# Patient Record
Sex: Female | Born: 1945 | Race: Black or African American | Hispanic: No | Smoking: Never smoker
Health system: Southern US, Community
[De-identification: ages and names within clinical notes are randomized; demographics above are authoritative.]

## PROBLEM LIST (undated history)

## (undated) DIAGNOSIS — J45909 Unspecified asthma, uncomplicated: Secondary | ICD-10-CM

## (undated) DIAGNOSIS — C649 Malignant neoplasm of unspecified kidney, except renal pelvis: Secondary | ICD-10-CM

## (undated) HISTORY — PX: APPENDECTOMY: SHX54

## (undated) HISTORY — PX: CHOLECYSTECTOMY: SHX55

## (undated) HISTORY — PX: ABDOMINAL HYSTERECTOMY: SHX81

## (undated) HISTORY — PX: BREAST SURGERY: SHX581

---

## 2017-05-18 ENCOUNTER — Emergency Department (HOSPITAL_BASED_OUTPATIENT_CLINIC_OR_DEPARTMENT_OTHER): Payer: Medicare Other

## 2017-05-18 ENCOUNTER — Observation Stay (HOSPITAL_BASED_OUTPATIENT_CLINIC_OR_DEPARTMENT_OTHER)
Admission: EM | Admit: 2017-05-18 | Discharge: 2017-05-19 | Disposition: A | Discharge status: Home / Self Care | Payer: Medicare Other | Attending: Family Medicine | Admitting: Family Medicine

## 2017-05-18 ENCOUNTER — Encounter (HOSPITAL_BASED_OUTPATIENT_CLINIC_OR_DEPARTMENT_OTHER): Payer: Self-pay | Admitting: *Deleted

## 2017-05-18 ENCOUNTER — Other Ambulatory Visit: Payer: Self-pay

## 2017-05-18 DIAGNOSIS — A419 Sepsis, unspecified organism: Secondary | ICD-10-CM | POA: Diagnosis present

## 2017-05-18 DIAGNOSIS — Z79899 Other long term (current) drug therapy: Secondary | ICD-10-CM | POA: Diagnosis not present

## 2017-05-18 DIAGNOSIS — J45901 Unspecified asthma with (acute) exacerbation: Principal | ICD-10-CM | POA: Diagnosis present

## 2017-05-18 DIAGNOSIS — R030 Elevated blood-pressure reading, without diagnosis of hypertension: Secondary | ICD-10-CM | POA: Diagnosis present

## 2017-05-18 DIAGNOSIS — Z85528 Personal history of other malignant neoplasm of kidney: Secondary | ICD-10-CM | POA: Diagnosis not present

## 2017-05-18 DIAGNOSIS — E876 Hypokalemia: Secondary | ICD-10-CM | POA: Diagnosis not present

## 2017-05-18 HISTORY — DX: Malignant neoplasm of unspecified kidney, except renal pelvis: C64.9

## 2017-05-18 HISTORY — DX: Unspecified asthma, uncomplicated: J45.909

## 2017-05-18 LAB — BASIC METABOLIC PANEL
ANION GAP: 9 (ref 5–15)
BUN: 11 mg/dL (ref 6–20)
CHLORIDE: 101 mmol/L (ref 101–111)
CO2: 26 mmol/L (ref 22–32)
Calcium: 8.2 mg/dL — ABNORMAL LOW (ref 8.9–10.3)
Creatinine, Ser: 1.02 mg/dL — ABNORMAL HIGH (ref 0.44–1.00)
GFR calc Af Amer: >60 mL/min (ref >60)
GFR calc non Af Amer: 53 mL/min — ABNORMAL LOW (ref >60)
GLUCOSE: 135 mg/dL — AB (ref 65–99)
POTASSIUM: 2.9 mmol/L — AB (ref 3.5–5.1)
Sodium: 136 mmol/L (ref 135–145)

## 2017-05-18 LAB — CBC WITH DIFFERENTIAL/PLATELET
BASOS ABS: 0 10*3/uL (ref 0.0–0.1)
Basophils Relative: 0 %
EOS PCT: 1 %
Eosinophils Absolute: 0.1 10*3/uL (ref 0.0–0.7)
HEMATOCRIT: 39.6 % (ref 36.0–46.0)
Hemoglobin: 12.9 g/dL (ref 12.0–15.0)
LYMPHS ABS: 2.5 10*3/uL (ref 0.7–4.0)
LYMPHS PCT: 21 %
MCH: 27.8 pg (ref 26.0–34.0)
MCHC: 32.6 g/dL (ref 30.0–36.0)
MCV: 85.3 fL (ref 78.0–100.0)
MONO ABS: 0.9 10*3/uL (ref 0.1–1.0)
Monocytes Relative: 8 %
NEUTROS ABS: 8.4 10*3/uL — AB (ref 1.7–7.7)
Neutrophils Relative %: 70 %
PLATELETS: 251 10*3/uL (ref 150–400)
RBC: 4.64 MIL/uL (ref 3.87–5.11)
RDW: 13.7 % (ref 11.5–15.5)
WBC: 11.8 10*3/uL — ABNORMAL HIGH (ref 4.0–10.5)

## 2017-05-18 LAB — TROPONIN I: Troponin I: <0.03 ng/mL (ref <0.03)

## 2017-05-18 MED ORDER — POTASSIUM CHLORIDE CRYS ER 20 MEQ PO TBCR
EXTENDED_RELEASE_TABLET | ORAL | Status: AC
  Filled 2017-05-18: qty 1

## 2017-05-18 MED ORDER — POTASSIUM CHLORIDE CRYS ER 20 MEQ PO TBCR
60.0000 meq | EXTENDED_RELEASE_TABLET | Freq: Once | ORAL | Status: AC
  Administered 2017-05-18: 60 meq via ORAL
  Filled 2017-05-18: qty 3

## 2017-05-18 MED ORDER — IPRATROPIUM BROMIDE 0.02 % IN SOLN
0.5000 mg | RESPIRATORY_TRACT | Status: DC
  Administered 2017-05-18 – 2017-05-19 (×2): 0.5 mg via RESPIRATORY_TRACT
  Filled 2017-05-18 (×2): qty 2.5

## 2017-05-18 MED ORDER — ALBUTEROL (5 MG/ML) CONTINUOUS INHALATION SOLN
10.0000 mg/h | INHALATION_SOLUTION | RESPIRATORY_TRACT | Status: AC
Start: 2017-05-18 — End: 2017-05-18
  Administered 2017-05-18: 10 mg/h via RESPIRATORY_TRACT
  Filled 2017-05-18: qty 20

## 2017-05-18 MED ORDER — ALBUTEROL SULFATE (2.5 MG/3ML) 0.083% IN NEBU
2.5000 mg | INHALATION_SOLUTION | Freq: Once | RESPIRATORY_TRACT | Status: AC
  Administered 2017-05-18: 2.5 mg via RESPIRATORY_TRACT
  Filled 2017-05-18: qty 3

## 2017-05-18 MED ORDER — IPRATROPIUM BROMIDE 0.02 % IN SOLN
0.5000 mg | Freq: Once | RESPIRATORY_TRACT | Status: AC
  Administered 2017-05-18: 0.5 mg via RESPIRATORY_TRACT
  Filled 2017-05-18: qty 2.5

## 2017-05-18 MED ORDER — ALBUTEROL SULFATE (2.5 MG/3ML) 0.083% IN NEBU
5.0000 mg | INHALATION_SOLUTION | Freq: Once | RESPIRATORY_TRACT | Status: AC
  Administered 2017-05-18: 5 mg via RESPIRATORY_TRACT
  Filled 2017-05-18: qty 6

## 2017-05-18 MED ORDER — MAGNESIUM SULFATE 2 GM/50ML IV SOLN
2.0000 g | Freq: Once | INTRAVENOUS | Status: AC
  Administered 2017-05-18: 2 g via INTRAVENOUS
  Filled 2017-05-18: qty 50

## 2017-05-18 MED ORDER — MAGNESIUM SULFATE 50 % IJ SOLN
2.0000 g | Freq: Once | INTRAMUSCULAR | Status: DC
  Filled 2017-05-18: qty 4

## 2017-05-18 MED ORDER — ALBUTEROL SULFATE (2.5 MG/3ML) 0.083% IN NEBU: 2.5000 mg | INHALATION_SOLUTION | RESPIRATORY_TRACT | Status: DC | PRN

## 2017-05-18 MED ORDER — METHYLPREDNISOLONE SODIUM SUCC 125 MG IJ SOLR: 125.0000 mg | Freq: Once | INTRAMUSCULAR | Status: DC

## 2017-05-18 MED ORDER — DM-GUAIFENESIN ER 30-600 MG PO TB12: 1.0000 | ORAL_TABLET | Freq: Two times a day (BID) | ORAL | Status: DC

## 2017-05-18 MED ORDER — ZOLPIDEM TARTRATE 5 MG PO TABS
5.0000 mg | ORAL_TABLET | Freq: Every evening | ORAL | Status: DC | PRN
  Administered 2017-05-18: 5 mg via ORAL
  Filled 2017-05-18: qty 1

## 2017-05-18 MED ORDER — PREDNISONE 50 MG PO TABS
60.0000 mg | ORAL_TABLET | Freq: Once | ORAL | Status: AC
  Administered 2017-05-18: 60 mg via ORAL
  Filled 2017-05-18: qty 1

## 2017-05-18 MED ORDER — IPRATROPIUM-ALBUTEROL 0.5-2.5 (3) MG/3ML IN SOLN
3.0000 mL | Freq: Once | RESPIRATORY_TRACT | Status: AC
  Administered 2017-05-18: 3 mL via RESPIRATORY_TRACT
  Filled 2017-05-18: qty 3

## 2017-05-18 MED ORDER — DM-GUAIFENESIN ER 30-600 MG PO TB12
1.0000 | ORAL_TABLET | Freq: Two times a day (BID) | ORAL | Status: DC | PRN
  Administered 2017-05-18: 1 via ORAL
  Filled 2017-05-18: qty 1

## 2017-05-18 MED ORDER — ALBUTEROL SULFATE (2.5 MG/3ML) 0.083% IN NEBU: 2.5000 mg | INHALATION_SOLUTION | Freq: Four times a day (QID) | RESPIRATORY_TRACT | Status: DC | PRN

## 2017-05-18 NOTE — ED Notes (Signed)
Unsuccessful IV attempt x 2, left AC and right hand.

## 2017-05-18 NOTE — ED Triage Notes (Signed)
Pt c/o URI symptoms 1 week, seen last week in [facility], no relief with meds

## 2017-05-18 NOTE — H&P (Signed)
History and Physical    Jessica Sparks FGH:[id number] DOB: December 09, 1945 DOA: 05/18/2017  Referring MD/NP/PA:   PCP: Patient, No Pcp Per   Patient coming from:  The patient is coming from home.  At baseline, pt is independent for most of ADL.  Chief Complaint: Shortness of breath, and cough  HPI: Jessica Sparks is a 71 y.o. female with medical history significant of asthma, renal cell cancer (right nephrectomy 8 years ago, no radiation or chemotherapy per patient), who presents with shortness of breath and cough.  Patient states that she has been having cough, shortness breath for about 1 week, which has worsened in the past 2 days. She coughs up white mucus. She also has mild runny nose and sore throat. She has pleuritic chest pain when coughing, which is located in the frontal chest, sharp, 4 out of 10 in severity, nonradiating. No nausea, vomiting, diarrhea or abdominal pain. No symptoms of UTI or unilateral weakness. No fever or chills. Patient speaks in full sentence.  ED Course: pt was found to have WBC 11.8, negative troponin, potassium 2.9, creatinine normal, temperature normal, tachycardia, tachypnea, oxygen saturation section 96% on room air, chest x-ray negative for infiltration. Patient is admitted to [place] bed as inpatient.   Review of Systems:   General: no fevers, chills, no body weight gain, has fatigue HEENT: no blurry vision, hearing changes or sore throat Respiratory: has dyspnea, coughing, wheezing CV: has chest pain, no palpitations GI: no nausea, vomiting, abdominal pain, diarrhea, constipation GU: no dysuria, burning on urination, increased urinary frequency, hematuria  Ext: no leg edema Neuro: no unilateral weakness, numbness, or tingling, no vision change or hearing loss Skin: no rash, no skin tear. MSK: No muscle spasm, no deformity, no limitation of range of movement in spin Heme: No easy bruising.  Travel history: No recent long distant travel.  Allergy:    Allergies  Allergen Reactions  . Citrus Itching    Mouth itches with excessive citrus  . Sulfa Antibiotics     Past Medical History:  Diagnosis Date  . Asthma   . Renal cancer [facility])     Past Surgical History:  Procedure Laterality Date  . ABDOMINAL HYSTERECTOMY    . APPENDECTOMY    . BREAST SURGERY    . CHOLECYSTECTOMY      Social History:  reports that  has never smoked. she has never used smokeless tobacco. She reports that she does not drink alcohol or use drugs.  Family History:  Family History  Problem Relation Age of Onset  . Hypertension Mother   . Hypertension Father   . Hypertension Sister   . Hypertension Brother      Prior to Admission medications   Medication Sig Start Date End Date Taking? Authorizing Provider  acetaminophen (TYLENOL) 500 MG tablet Take 500 mg by mouth every 6 (six) hours as needed for mild pain or headache.   Yes [provider]  albuterol (PROVENTIL) (2.5 MG/3ML) 0.083% nebulizer solution Take 2.5 mg by nebulization every 6 (six) hours as needed for wheezing or shortness of breath.   Yes [provider]  Aspirin-Salicylamide-Caffeine (BC HEADACHE POWDER PO) Take 1 packet by mouth daily as needed (pain).   Yes [provider]  fluticasone (FLONASE) 50 MCG/ACT nasal spray Place 2 sprays into both nostrils daily as needed for allergies or rhinitis.   Yes [provider]  Fluticasone Furoate (ARNUITY ELLIPTA) 50 MCG/ACT AEPB Inhale 1 puff into the lungs daily.   Yes [provider]  Fluticasone-Umeclidin-Vilant (TRELEGY ELLIPTA) 100-62.5-25 MCG/INH AEPB Inhale 1 puff into the lungs daily.   Yes [provider]  levocetirizine (XYZAL) 5 MG tablet Take 5 mg by mouth daily as needed for allergies.   Yes [provider]    Physical Exam: Vitals:   05/18/17 1937 05/18/17 2143 05/18/17 2319 05/19/17 0500  BP: (!) 160/75 (!) 171/67  (!) 154/62  Pulse: (!) 108 (!) 106  77  Resp: (!) 24  20  20   Temp:  98.7 F (37.1 C)  98.2 F (36.8 C)  TempSrc:  Oral  Oral  SpO2: 96% 96% 97% 98%  Weight:      Height:       General: Not in acute distress HEENT:       Eyes: PERRL, EOMI, no scleral icterus.       ENT: No discharge from the ears and nose, no pharynx injection, no tonsillar enlargement.        Neck: No JVD, no bruit, no mass felt. Heme: No neck lymph node enlargement. Cardiac: S1/S2, RRR, No murmurs, No gallops or rubs. Respiratory: Has wheezing bilaterally. GI: Soft, nondistended, nontender, no rebound pain, no organomegaly, BS present. GU: No hematuria Ext: No pitting leg edema bilaterally. 2+DP/PT pulse bilaterally. Musculoskeletal: No joint deformities, No joint redness or warmth, no limitation of ROM in spin. Skin: No rashes.  Neuro: Alert, oriented X3, cranial nerves II-XII grossly intact, moves all extremities normally. Psych: Patient is not psychotic, no suicidal or hemocidal ideation.  Labs on Admission: I have personally reviewed following labs and imaging studies  CBC: Recent Labs  Lab 05/18/17 1530  WBC 11.8*  NEUTROABS 8.4*  HGB 12.9  HCT 39.6  MCV 85.3  PLT 696   Basic Metabolic Panel: Recent Labs  Lab 05/18/17 1530 05/19/17 0518  NA 136  --   K 2.9*  --   CL 101  --   CO2 26  --   GLUCOSE 135*  --   BUN 11  --   CREATININE 1.02*  --   CALCIUM 8.2*  --   MG  --  2.2   GFR: Estimated Creatinine Clearance: 41.6 mL/min (A) (by C-G formula based on SCr of 1.02 mg/dL (H)). Liver Function Tests: No results for input(s): AST, ALT, ALKPHOS, BILITOT, PROT, ALBUMIN in the last 168 hours. No results for input(s): LIPASE, AMYLASE in the last 168 hours. No results for input(s): AMMONIA in the last 168 hours. Coagulation Profile: No results for input(s): INR, PROTIME in the last 168 hours. Cardiac Enzymes: Recent Labs  Lab 05/18/17 1427  TROPONINI <0.03   BNP (last 3 results) No results for input(s): PROBNP in the last 8760  hours. HbA1C: No results for input(s): HGBA1C in the last 72 hours. CBG: No results for input(s): GLUCAP in the last 168 hours. Lipid Profile: No results for input(s): CHOL, HDL, LDLCALC, TRIG, CHOLHDL, LDLDIRECT in the last 72 hours. Thyroid Function Tests: No results for input(s): TSH, T4TOTAL, FREET4, T3FREE, THYROIDAB in the last 72 hours. Anemia Panel: No results for input(s): VITAMINB12, FOLATE, FERRITIN, TIBC, IRON, RETICCTPCT in the last 72 hours. Urine analysis: No results found for: COLORURINE, APPEARANCEUR, LABSPEC, PHURINE, GLUCOSEU, HGBUR, BILIRUBINUR, KETONESUR, PROTEINUR, UROBILINOGEN, NITRITE, LEUKOCYTESUR Sepsis Labs: @LABRCNTIP (procalcitonin:4,lacticidven:4) )No results found for this or any previous visit (from the past 240 hour(s)).   Radiological Exams on Admission: Dg Chest 2 View  Result Date: 05/18/2017 CLINICAL DATA:  Cough, chest pain, shortness of Breath EXAM: CHEST  2 VIEW  COMPARISON:  None. FINDINGS: Cardiomegaly. Lungs are clear. No effusions. No acute bony abnormality. IMPRESSION: Cardiomegaly.  No active disease. Electronically Signed   By: Rolm Baptise M.D.   On: 05/18/2017 13:38   Dg Chest Port 1 View  Result Date: 05/19/2017 CLINICAL DATA:  Shortness of breath and sepsis EXAM: PORTABLE CHEST 1 VIEW COMPARISON:  05/18/2017 FINDINGS: The heart size and mediastinal contours are within normal limits. Both lungs are clear. The visualized skeletal structures are unremarkable. IMPRESSION: No active disease. Electronically Signed   By: Donavan Foil M.D.   On: 05/19/2017 03:51     EKG: Independently reviewed.  Not done in ED, will get one.   Assessment/Plan Principal Problem:   Asthma exacerbation Active Problems:   Hypokalemia   Elevated blood pressure reading   Sepsis ([place])   Asthma with acute exacerbation   Asthma exacerbation and sepsis: Patient's productive cough, wheezing, negative chest x-ray, consistent with asthma exacerbation. Patient meets  criteria for sepsis with leukocytosis, tachycardia and tachypnea. Currently hemodynamically stable.  -will admit to tele bed as inpt -Nebulizers:  Scheduled Atrovent and prn Xopenex nebs -Solu-Medrol 80 mg IV q8h - Z-pak  -Mucinex for cough  -Urine S. pneumococcal antigen -Follow up blood culture x2, sputum culture, respiratory virus panel, Flu pcr -will get Procalcitonin and trend lactic acid levels per sepsis protocol. -IVF: 2L of NS bolus in ED, followed by 125 cc/h   Hypokalemia: K=2.9  on admission. - Repleted - Give 1 g of magnesium sulfate - Check Mg level  Elevated blood pressure reading: Blood pressure 171/67. No history of hypertension -When necessary hydralazine IV   DVT ppx:   SQ Lovenox Code Status: Full code Family Communication: None at bed side.    Disposition Plan:  Anticipate discharge back to previous home environment Consults called:  none Admission status:   medical floor/inpt  Date of Service 05/19/2017    Ivor Costa [facility] Pager [phone]  If 7PM-7AM, please contact night-coverage [email or web address] Password [facility] 05/19/2017, 6:32 AM

## 2017-05-18 NOTE — ED Provider Notes (Signed)
[facility] EMERGENCY DEPARTMENT Provider Note   CSN: [id number] Arrival date & time: 05/18/17  1237     History   Chief Complaint Chief Complaint  Patient presents with  . URI    HPI Jessica Sparks is a 71 y.o. female with history of asthma who presents with over 1 week history of cough, shortness of breath, wheezing.  Patient began having chest pain today.  It is worse with coughing and deep breathing, however it is a constant ache at rest.  Patient reports she has been taking montelukast, Tessalon, and was given methylprednisone at [facility] emergency department 1 week ago.  She denies any improvement after these medications.  She reports she has felt like she had a fever at home.  She reports worsening symptoms over the past week.  She denies any abdominal pain, nausea, vomiting, urinary symptoms.  She denies any recent long trips, surgeries, known cancer, new leg pain or swelling, history of blood clots.  HPI  Past Medical History:  Diagnosis Date  . Asthma   . Renal cancer [facility])     Patient Active Problem List   Diagnosis Date Noted  . Asthma exacerbation 05/18/2017    Past Surgical History:  Procedure Laterality Date  . ABDOMINAL HYSTERECTOMY    . APPENDECTOMY    . CHOLECYSTECTOMY      OB History    No data available       Home Medications    Prior to Admission medications   Not on File    Family History No family history on file.  Social History Social History   Tobacco Use  . Smoking status: Never Smoker  Substance Use Topics  . Alcohol use: No    Frequency: Never  . Drug use: No     Allergies   Sulfa antibiotics   Review of Systems Review of Systems  Constitutional: Positive for fever (subjective). Negative for chills.  HENT: Negative for facial swelling and sore throat.   Respiratory: Positive for cough, shortness of breath and wheezing.   Cardiovascular: Positive for chest pain.  Gastrointestinal: Negative for abdominal  pain, nausea and vomiting.  Genitourinary: Negative for dysuria.  Musculoskeletal: Negative for back pain.  Skin: Negative for rash and wound.  Neurological: Negative for headaches.  Psychiatric/Behavioral: The patient is not nervous/anxious.      Physical Exam Updated Vital Signs BP (!) 147/69   Pulse (!) 109   Temp 98.9 F (37.2 C) (Oral)   Resp (!) 22   Ht 5' (1.524 m)   Wt 65.8 kg (145 lb)   SpO2 95%   BMI 28.32 kg/m   Physical Exam  Constitutional: She appears well-developed and well-nourished. No distress.  HENT:  Head: Normocephalic and atraumatic.  Mouth/Throat: Oropharynx is clear and moist. No oropharyngeal exudate.  Eyes: Conjunctivae are normal. Pupils are equal, round, and reactive to light. Right eye exhibits no discharge. Left eye exhibits no discharge. No scleral icterus.  Neck: Normal range of motion. Neck supple. No thyromegaly present.  Cardiovascular: Normal rate, regular rhythm, normal heart sounds and intact distal pulses. Exam reveals no gallop and no friction rub.  No murmur heard. Pulmonary/Chest: Effort normal. No stridor. No respiratory distress. She has wheezes (Expiratory bilaterally in all lung fields). She has no rales.  Abdominal: Soft. Bowel sounds are normal. She exhibits no distension. There is no tenderness. There is no rebound and no guarding.  Musculoskeletal: She exhibits no edema.  Lymphadenopathy:    She has no cervical  adenopathy.  Neurological: She is alert. Coordination normal.  Skin: Skin is warm and dry. No rash noted. She is not diaphoretic. No pallor.  Psychiatric: She has a normal mood and affect.  Nursing note and vitals reviewed.    ED Treatments / Results  Labs (all labs ordered are listed, but only abnormal results are displayed) Labs Reviewed  BASIC METABOLIC PANEL - Abnormal; Notable for the following components:      Result Value   Potassium 2.9 (*)    Glucose, Bld 135 (*)    Creatinine, Ser 1.02 (*)     Calcium 8.2 (*)    GFR calc non Af Amer 53 (*)    All other components within normal limits  CBC WITH DIFFERENTIAL/PLATELET - Abnormal; Notable for the following components:   WBC 11.8 (*)    Neutro Abs 8.4 (*)    All other components within normal limits  TROPONIN I  CBC WITH DIFFERENTIAL/PLATELET    EKG  EKG Interpretation  Date/Time:  Saturday May 18 2017 13:14:02 EST Ventricular Rate:  83 PR Interval:    QRS Duration: 91 QT Interval:  320 QTC Calculation: 376 R Axis:   -28 Text Interpretation:  Sinus rhythm Nonspecific T wave abnormality Baseline wander No previous tracing Confirmed by Lajean Saver [phone]) on 05/18/2017 1:17:20 PM       Radiology Dg Chest 2 View  Result Date: 05/18/2017 CLINICAL DATA:  Cough, chest pain, shortness of Breath EXAM: CHEST  2 VIEW COMPARISON:  None. FINDINGS: Cardiomegaly. Lungs are clear. No effusions. No acute bony abnormality. IMPRESSION: Cardiomegaly.  No active disease. Electronically Signed   By: Rolm Baptise M.D.   On: 05/18/2017 13:38    Procedures Procedures (including critical care time)  Medications Ordered in ED Medications  albuterol (PROVENTIL,VENTOLIN) solution continuous neb (0 mg/hr Nebulization Stopped 05/18/17 1645)  magnesium sulfate IVPB 2 g 50 mL (2 g Intravenous New Bag/Given 05/18/17 1707)  albuterol (PROVENTIL) (2.5 MG/3ML) 0.083% nebulizer solution 2.5 mg (not administered)  ipratropium-albuterol (DUONEB) 0.5-2.5 (3) MG/3ML nebulizer solution 3 mL (3 mLs Nebulization Given 05/18/17 1300)  albuterol (PROVENTIL) (2.5 MG/3ML) 0.083% nebulizer solution 2.5 mg (2.5 mg Nebulization Given 05/18/17 1300)  albuterol (PROVENTIL) (2.5 MG/3ML) 0.083% nebulizer solution 5 mg (5 mg Nebulization Given 05/18/17 1414)  ipratropium (ATROVENT) nebulizer solution 0.5 mg (0.5 mg Nebulization Given 05/18/17 1414)  predniSONE (DELTASONE) tablet 60 mg (60 mg Oral Given 05/18/17 1420)  potassium chloride SA (K-DUR,KLOR-CON) CR tablet 60  mEq (60 mEq Oral Given 05/18/17 1639)     Initial Impression / Assessment and Plan / ED Course  I have reviewed the triage vital signs and the nursing notes.  Pertinent labs & imaging results that were available during my care of the patient were reviewed by me and considered in my medical decision making (see chart for details).     Patient with asthma exacerbation unimproved with 2 DuoNeb treatments and continuous albuterol treatment in the ED.  Patient reports she is feeling mildly better, however her lung exam is not improved at all.  Patient still with expiratory wheezing bilaterally that is essentially unchanged from first exam.  Chest x-ray shows cardiomegaly, but no other active disease.  Will give magnesium 2 g.  Patient also given potassium, as patient is hypokalemic at 2.9.  I consulted [facility] and spoke with Dr. Charlies Silvers who will admit the patient to [place] at [facility].  Patient understands and agrees with plan.  Patient also evaluated by Dr. Ashok Cordia who  had the patient's management and agrees with plan.  Final Clinical Impressions(s) / ED Diagnoses   Final diagnoses:  Asthma with acute exacerbation, unspecified asthma severity, unspecified whether persistent    ED Discharge Orders    None       Frederica Kuster, PA-C 05/18/17 1746    Lajean Saver, MD 05/19/17 [phone]

## 2017-05-19 ENCOUNTER — Encounter (HOSPITAL_COMMUNITY): Payer: Self-pay

## 2017-05-19 ENCOUNTER — Inpatient Hospital Stay (HOSPITAL_COMMUNITY): Payer: Medicare Other

## 2017-05-19 DIAGNOSIS — A419 Sepsis, unspecified organism: Secondary | ICD-10-CM | POA: Diagnosis present

## 2017-05-19 DIAGNOSIS — E876 Hypokalemia: Secondary | ICD-10-CM

## 2017-05-19 DIAGNOSIS — R030 Elevated blood-pressure reading, without diagnosis of hypertension: Secondary | ICD-10-CM | POA: Diagnosis not present

## 2017-05-19 DIAGNOSIS — J45901 Unspecified asthma with (acute) exacerbation: Secondary | ICD-10-CM

## 2017-05-19 LAB — LACTIC ACID, PLASMA
Lactic Acid, Venous: 1.8 mmol/L (ref 0.5–1.9)
Lactic Acid, Venous: 2.5 mmol/L (ref 0.5–1.9)
Lactic Acid, Venous: 1.2 mmol/L (ref 0.5–1.9)

## 2017-05-19 LAB — BASIC METABOLIC PANEL
Anion gap: 6 (ref 5–15)
BUN: 13 mg/dL (ref 6–20)
CHLORIDE: 110 mmol/L (ref 101–111)
CO2: 21 mmol/L — AB (ref 22–32)
CREATININE: 0.85 mg/dL (ref 0.44–1.00)
Calcium: 8.2 mg/dL — ABNORMAL LOW (ref 8.9–10.3)
GFR calc Af Amer: >60 mL/min (ref >60)
GFR calc non Af Amer: >60 mL/min (ref >60)
GLUCOSE: 131 mg/dL — AB (ref 65–99)
Potassium: 4.3 mmol/L (ref 3.5–5.1)
Sodium: 137 mmol/L (ref 135–145)

## 2017-05-19 LAB — STREP PNEUMONIAE URINARY ANTIGEN: STREP PNEUMO URINARY ANTIGEN: NEGATIVE

## 2017-05-19 LAB — EXPECTORATED SPUTUM ASSESSMENT W GRAM STAIN, RFLX TO RESP C

## 2017-05-19 LAB — EXPECTORATED SPUTUM ASSESSMENT W REFEX TO RESP CULTURE

## 2017-05-19 LAB — MAGNESIUM: Magnesium: 2.2 mg/dL (ref 1.7–2.4)

## 2017-05-19 LAB — PROCALCITONIN: Procalcitonin: <0.1 ng/mL

## 2017-05-19 MED ORDER — SODIUM CHLORIDE 0.9 % IV SOLN
INTRAVENOUS | Status: DC
  Administered 2017-05-19: 05:00:00 via INTRAVENOUS

## 2017-05-19 MED ORDER — HYDRALAZINE HCL 20 MG/ML IJ SOLN: 5.0000 mg | INTRAMUSCULAR | Status: DC | PRN

## 2017-05-19 MED ORDER — AZITHROMYCIN 250 MG PO TABS: 250.0000 mg | ORAL_TABLET | Freq: Every day | ORAL | Status: DC

## 2017-05-19 MED ORDER — IPRATROPIUM BROMIDE 0.02 % IN SOLN
0.5000 mg | Freq: Four times a day (QID) | RESPIRATORY_TRACT | Status: DC
  Administered 2017-05-19 (×2): 0.5 mg via RESPIRATORY_TRACT
  Filled 2017-05-19 (×2): qty 2.5

## 2017-05-19 MED ORDER — ACETAMINOPHEN 325 MG PO TABS: 650.0000 mg | ORAL_TABLET | Freq: Four times a day (QID) | ORAL | Status: DC | PRN

## 2017-05-19 MED ORDER — AZITHROMYCIN 250 MG PO TABS
250.0000 mg | ORAL_TABLET | Freq: Every day | ORAL | 0 refills | Status: AC
Start: 2017-05-19 — End: 2017-05-22

## 2017-05-19 MED ORDER — PREDNISONE 10 MG PO TABS: ORAL_TABLET | ORAL | 0 refills | Status: AC

## 2017-05-19 MED ORDER — SODIUM CHLORIDE 0.9 % IV BOLUS (SEPSIS)
2000.0000 mL | Freq: Once | INTRAVENOUS | Status: AC
  Administered 2017-05-19: 2000 mL via INTRAVENOUS

## 2017-05-19 MED ORDER — AZITHROMYCIN 250 MG PO TABS
500.0000 mg | ORAL_TABLET | Freq: Every day | ORAL | Status: AC
  Administered 2017-05-19: 500 mg via ORAL
  Filled 2017-05-19: qty 2

## 2017-05-19 MED ORDER — LEVALBUTEROL HCL 1.25 MG/0.5ML IN NEBU: 1.2500 mg | INHALATION_SOLUTION | Freq: Four times a day (QID) | RESPIRATORY_TRACT | Status: DC

## 2017-05-19 MED ORDER — POTASSIUM CHLORIDE 10 MEQ/100ML IV SOLN
10.0000 meq | INTRAVENOUS | Status: AC
  Administered 2017-05-19 (×2): 10 meq via INTRAVENOUS
  Filled 2017-05-19 (×2): qty 100

## 2017-05-19 MED ORDER — AZITHROMYCIN 250 MG PO TABS
250.0000 mg | ORAL_TABLET | Freq: Every day | ORAL | Status: DC
Start: 2017-05-20 — End: 2017-05-19

## 2017-05-19 MED ORDER — AZITHROMYCIN 250 MG PO TABS: 500.0000 mg | ORAL_TABLET | Freq: Every day | ORAL | Status: DC

## 2017-05-19 MED ORDER — ENOXAPARIN SODIUM 40 MG/0.4ML ~~LOC~~ SOLN
40.0000 mg | SUBCUTANEOUS | Status: DC
  Administered 2017-05-19: 40 mg via SUBCUTANEOUS
  Filled 2017-05-19: qty 0.4

## 2017-05-19 MED ORDER — METHYLPREDNISOLONE SODIUM SUCC 125 MG IJ SOLR
60.0000 mg | Freq: Three times a day (TID) | INTRAMUSCULAR | Status: DC
  Administered 2017-05-19 (×2): 60 mg via INTRAVENOUS
  Filled 2017-05-19 (×2): qty 2

## 2017-05-19 MED ORDER — LEVALBUTEROL HCL 1.25 MG/0.5ML IN NEBU
1.2500 mg | INHALATION_SOLUTION | Freq: Four times a day (QID) | RESPIRATORY_TRACT | Status: DC
  Administered 2017-05-19 (×2): 1.25 mg via RESPIRATORY_TRACT
  Filled 2017-05-19 (×2): qty 0.5

## 2017-05-19 MED ORDER — HYDRALAZINE HCL 25 MG PO TABS
25.0000 mg | ORAL_TABLET | Freq: Once | ORAL | Status: AC
  Administered 2017-05-19: 25 mg via ORAL
  Filled 2017-05-19: qty 1

## 2017-05-19 MED ORDER — DM-GUAIFENESIN ER 30-600 MG PO TB12: 1.0000 | ORAL_TABLET | Freq: Two times a day (BID) | ORAL | 0 refills | Status: AC | PRN

## 2017-05-19 MED ORDER — ONDANSETRON HCL 4 MG/2ML IJ SOLN: 4.0000 mg | Freq: Three times a day (TID) | INTRAMUSCULAR | Status: DC | PRN

## 2017-05-19 NOTE — Care Management Note (Signed)
Case Management Note  Patient Details  Name: Jessica Sparks MRN: [id number] Date of Birth: Jan 30, 1946  Subjective/Objective:    Asthma exacerbation                 Action/Plan: Discharge Planning: NCM spoke to pt and she lives at home. 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 She was independent at home. She will stay with a daughter this evening. Has a neb machine at home.    PCP Tamela Gammon MD   Expected Discharge Date:  05/19/17               Expected Discharge Plan:  Home/Self Care  In-House Referral:  NA  Discharge planning Services  CM Consult  Post Acute Care Choice:  NA Choice offered to:  NA  DME Arranged:  N/A DME Agency:  NA  HH Arranged:  NA HH Agency:  NA  Status of Service:  Completed, signed off  If discussed at [place] of Stay Meetings, dates discussed:    Additional Comments:  Erenest Rasher, RN 05/19/2017, 4:35 PM

## 2017-05-19 NOTE — Care Management Obs Status (Signed)
[place] NOTIFICATION   Patient Details  Name: Makaylen Thieme MRN: [id number] Date of Birth: 05/29/1946   Medicare Observation Status Notification Given:  Yes    Erenest Rasher, RN 05/19/2017, 4:31 PM

## 2017-05-19 NOTE — Care Management CC44 (Signed)
Condition Code 44 Documentation Completed  Patient Details  Name: Siddhi Dornbush MRN: [id number] Date of Birth: [birth year]   Condition Code 44 given:  Yes Patient signature on Condition Code 44 notice:  Yes Documentation of 2 MD's agreement:  Yes Code 44 added to claim:  Yes    Erenest Rasher, RN 05/19/2017, 4:31 PM

## 2017-05-19 NOTE — Discharge Summary (Signed)
Physician Discharge Summary  Jessica Sparks [id number]:[id number] DOB: 1946/02/24 DOA: 05/18/2017  PCP: Patient, No Pcp Per  Admit date: 05/18/2017 Discharge date: 05/19/2017  Time spent: 25 minutes  Recommendations for Outpatient Follow-up:  1. Follow up PCP in 2 weeks   Discharge Diagnoses:  Principal Problem:   Asthma exacerbation Active Problems:   Hypokalemia   Elevated blood pressure reading   Sepsis ([place])   Asthma with acute exacerbation   Discharge Condition: Stable  Diet recommendation: Regular diet  Filed Weights   05/18/17 1242  Weight: 65.8 kg (145 lb)    History of present illness:  71 y.o.femalewith medical history significant ofasthma, renal cell cancer (right nephrectomy 8 years ago,no radiation or chemotherapy per patient), who presents with shortness of breath and cough.    Hospital Course:  1. Asthma exacerbation-improved,  Was started on  Solu-Medrol, Mucinex, scheduled Atrovent and Xopenex nebulizer. Will discharge home on Po Zithromax 250 mg po daily x 3 days, Prednisone taper. 2. SIRS- resolved, came with leukocytosis, now has elevated lactic acid.  Chest x-ray showed no pneumonia.  Strep pneumo urinary antigen is negative.  Sputum culture is negative.  Patient currently on Zithromax.  Repeat lactic acid is 1.2 3. Hypokalemia-potassium was 2.9 yesterday, recheck potassium is 4.3 4. Hypertension- BP has been elevated, she takes antihypertensive  Medication at home and cannot remember the name of medication. Will give Hydralazine 25 mg po x 1 before discharge home. She has been instructed to check BP at home and call PCP to adjust medications if it remains elevated.     Procedures:  None   Consultations:  None   Discharge Exam: Vitals:   05/19/17 1019 05/19/17 1400  BP:  (!) 168/72  Pulse:  81  Resp:  18  Temp:  98.6 F (37 C)  SpO2: 98% 100%    General: Appears in no acute distress Cardiovascular: S1S2 RRR Respiratory: Clear  bilaterally  Discharge Instructions   Discharge Instructions    Diet - low sodium heart healthy   Complete by:  As directed    Increase activity slowly   Complete by:  As directed      Allergies as of 05/19/2017      Reactions   Citrus Itching   Mouth itches with excessive citrus   Sulfa Antibiotics       Medication List    TAKE these medications   acetaminophen 500 MG tablet Commonly known as:  TYLENOL Take 500 mg by mouth every 6 (six) hours as needed for mild pain or headache.   albuterol (2.5 MG/3ML) 0.083% nebulizer solution Commonly known as:  PROVENTIL Take 2.5 mg by nebulization every 6 (six) hours as needed for wheezing or shortness of breath.   ARNUITY ELLIPTA 50 MCG/ACT Aepb Generic drug:  Fluticasone Furoate Inhale 1 puff into the lungs daily.   azithromycin 250 MG tablet Commonly known as:  ZITHROMAX Take 1 tablet (250 mg total) by mouth daily for 3 days.   BC HEADACHE POWDER PO Take 1 packet by mouth daily as needed (pain).   dextromethorphan-guaiFENesin 30-600 MG 12hr tablet Commonly known as:  MUCINEX DM Take 1 tablet by mouth 2 (two) times daily as needed for cough.   fluticasone 50 MCG/ACT nasal spray Commonly known as:  FLONASE Place 2 sprays into both nostrils daily as needed for allergies or rhinitis.   levocetirizine 5 MG tablet Commonly known as:  XYZAL Take 5 mg by mouth daily as needed for allergies.   predniSONE 10  MG tablet Commonly known as:  DELTASONE Prednisone 40 mg po daily x 1 day then Prednisone 30 mg po daily x 1 day then Prednisone 20 mg po daily x 1 day then Prednisone 10 mg daily x 1 day then stop...   TRELEGY ELLIPTA 100-62.5-25 MCG/INH Aepb Generic drug:  Fluticasone-Umeclidin-Vilant Inhale 1 puff into the lungs daily.      Allergies  Allergen Reactions  . Citrus Itching    Mouth itches with excessive citrus  . Sulfa Antibiotics       The results of significant diagnostics from this hospitalization  (including imaging, microbiology, ancillary and laboratory) are listed below for reference.    Significant Diagnostic Studies: Dg Chest 2 View  Result Date: 05/18/2017 CLINICAL DATA:  Cough, chest pain, shortness of Breath EXAM: CHEST  2 VIEW COMPARISON:  None. FINDINGS: Cardiomegaly. Lungs are clear. No effusions. No acute bony abnormality. IMPRESSION: Cardiomegaly.  No active disease. Electronically Signed   By: Rolm Baptise M.D.   On: 05/18/2017 13:38   Dg Chest Port 1 View  Result Date: 05/19/2017 CLINICAL DATA:  Shortness of breath and sepsis EXAM: PORTABLE CHEST 1 VIEW COMPARISON:  05/18/2017 FINDINGS: The heart size and mediastinal contours are within normal limits. Both lungs are clear. The visualized skeletal structures are unremarkable. IMPRESSION: No active disease. Electronically Signed   By: Donavan Foil M.D.   On: 05/19/2017 03:51    Microbiology: Recent Results (from the past 240 hour(s))  Culture, sputum-assessment     Status: None   Collection Time: 05/19/17  7:32 AM  Result Value Ref Range Status   Specimen Description SPUTUM  Final   Special Requests NONE  Final   Sputum evaluation THIS SPECIMEN IS ACCEPTABLE FOR SPUTUM CULTURE  Final   Report Status 05/19/2017 FINAL  Final     Labs: Basic Metabolic Panel: Recent Labs  Lab 05/18/17 1530 05/19/17 0518 05/19/17 1417  NA 136  --  137  K 2.9*  --  4.3  CL 101  --  110  CO2 26  --  21*  GLUCOSE 135*  --  131*  BUN 11  --  13  CREATININE 1.02*  --  0.85  CALCIUM 8.2*  --  8.2*  MG  --  2.2  --    Liver Function Tests: No results for input(s): AST, ALT, ALKPHOS, BILITOT, PROT, ALBUMIN in the last 168 hours. No results for input(s): LIPASE, AMYLASE in the last 168 hours. No results for input(s): AMMONIA in the last 168 hours. CBC: Recent Labs  Lab 05/18/17 1530  WBC 11.8*  NEUTROABS 8.4*  HGB 12.9  HCT 39.6  MCV 85.3  PLT 251   Cardiac Enzymes: Recent Labs  Lab 05/18/17 1427  TROPONINI <0.03         Signed:  Oswald Hillock MD.  [facility] 05/19/2017, 4:19 PM

## 2017-05-19 NOTE — Progress Notes (Addendum)
[facility]  PROGRESS NOTE  Jessica Sparks QMV:[id number] DOB: [birth year] DOA: 05/18/2017 PCP: Patient, No Pcp Per   Brief HPI:   72 y.o. female with medical history significant of asthma, renal cell cancer (right nephrectomy 8 years ago, no radiation or chemotherapy per patient), who presents with shortness of breath and cough.    Subjective   Patient is breathing better.   Assessment/Plan:     1. Asthma exacerbation-improved, continue Solu-Medrol, Mucinex, scheduled Atrovent and Xopenex nebulizer. 2. SIRS-came with leukocytosis, now has elevated lactic acid.  Chest x-ray showed no pneumonia.  Strep pneumo urinary antigen is negative.  Sputum culture is negative.  Patient currently on Zithromax.  Repeat lactic acid is 1.2 3. Hypokalemia-potassium was 2.9 yesterday, recheck potassium is 4.3    DVT prophylaxis: Lovenox  Code Status: Full code  Family Communication: No family at bedside  Disposition Plan: Likely home in 1-2 days   Consultants:  None  Procedures:  None  Continuous infusions . sodium chloride 100 mL/hr at 05/19/17 0431      Antibiotics:   Anti-infectives (From admission, onward)   Start     Dose/Rate Route Frequency Ordered Stop   05/20/17 1000  azithromycin (ZITHROMAX) tablet 250 mg  Status:  Discontinued     250 mg Oral Daily 05/19/17 0315 05/19/17 0424   05/20/17 1000  azithromycin (ZITHROMAX) tablet 250 mg     250 mg Oral Daily 05/19/17 0424 05/24/17 0959   05/19/17 1000  azithromycin (ZITHROMAX) tablet 500 mg  Status:  Discontinued     500 mg Oral Daily 05/19/17 0315 05/19/17 0424   05/19/17 0600  azithromycin (ZITHROMAX) tablet 500 mg     500 mg Oral Daily 05/19/17 0424 05/19/17 0604       Objective   Vitals:   05/18/17 2143 05/18/17 2319 05/19/17 0500 05/19/17 1019  BP: (!) 171/67  (!) 154/62   Pulse: (!) 106  77   Resp: 20  20   Temp: 98.7 F (37.1 C)  98.2 F (36.8 C)   TempSrc: Oral  Oral   SpO2: 96% 97% 98% 98%   Weight:      Height:        Intake/Output Summary (Last 24 hours) at 05/19/2017 1322 Last data filed at 05/19/2017 1000 Gross per 24 hour  Intake 3628.33 ml  Output 1100 ml  Net 2528.33 ml   Filed Weights   05/18/17 1242  Weight: 65.8 kg (145 lb)     Physical Examination:   Physical Exam: Eyes: No icterus, extraocular muscles intact  Mouth: Oral mucosa is moist, no lesions on palate,  Neck: Supple, no deformities, masses, or tenderness Lungs: Normal respiratory effort, bilateral clear to auscultation, no crackles or wheezes.  Heart: Regular rate and rhythm, S1 and S2 normal, no murmurs, rubs auscultated Abdomen: BS normoactive,soft,nondistended,non-tender to palpation,no organomegaly Extremities: No pretibial edema, no erythema, no cyanosis, no clubbing Neuro : Alert and oriented to time, place and person, No focal deficits Skin: No rashes seen on exam    Data Reviewed: I have personally reviewed following labs and imaging studies  CBG: No results for input(s): GLUCAP in the last 168 hours.  CBC: Recent Labs  Lab 05/18/17 1530  WBC 11.8*  NEUTROABS 8.4*  HGB 12.9  HCT 39.6  MCV 85.3  PLT 284    Basic Metabolic Panel: Recent Labs  Lab 05/18/17 1530 05/19/17 0518  NA 136  --   K 2.9*  --   CL 101  --  CO2 26  --   GLUCOSE 135*  --   BUN 11  --   CREATININE 1.02*  --   CALCIUM 8.2*  --   MG  --  2.2    Recent Results (from the past 240 hour(s))  Culture, sputum-assessment     Status: None   Collection Time: 05/19/17  7:32 AM  Result Value Ref Range Status   Specimen Description SPUTUM  Final   Special Requests NONE  Final   Sputum evaluation THIS SPECIMEN IS ACCEPTABLE FOR SPUTUM CULTURE  Final   Report Status 05/19/2017 FINAL  Final     Liver Function Tests: No results for input(s): AST, ALT, ALKPHOS, BILITOT, PROT, ALBUMIN in the last 168 hours. No results for input(s): LIPASE, AMYLASE in the last 168 hours. No results for input(s):  AMMONIA in the last 168 hours.  Cardiac Enzymes: Recent Labs  Lab 05/18/17 1427  TROPONINI <0.03   BNP (last 3 results) No results for input(s): BNP in the last 8760 hours.  ProBNP (last 3 results) No results for input(s): PROBNP in the last 8760 hours.    Studies: Dg Chest 2 View  Result Date: 05/18/2017 CLINICAL DATA:  Cough, chest pain, shortness of Breath EXAM: CHEST  2 VIEW COMPARISON:  None. FINDINGS: Cardiomegaly. Lungs are clear. No effusions. No acute bony abnormality. IMPRESSION: Cardiomegaly.  No active disease. Electronically Signed   By: Rolm Baptise M.D.   On: 05/18/2017 13:38   Dg Chest Port 1 View  Result Date: 05/19/2017 CLINICAL DATA:  Shortness of breath and sepsis EXAM: PORTABLE CHEST 1 VIEW COMPARISON:  05/18/2017 FINDINGS: The heart size and mediastinal contours are within normal limits. Both lungs are clear. The visualized skeletal structures are unremarkable. IMPRESSION: No active disease. Electronically Signed   By: Donavan Foil M.D.   On: 05/19/2017 03:51    Scheduled Meds: . [START ON 05/20/2017] azithromycin  250 mg Oral Daily  . enoxaparin (LOVENOX) injection  40 mg Subcutaneous Q24H  . ipratropium  0.5 mg Nebulization Q6H  . levalbuterol  1.25 mg Nebulization Q6H  . methylPREDNISolone (SOLU-MEDROL) injection  60 mg Intravenous TID      Time spent: 25 min  [facility] Pager [phone]. If 7PM-7AM, please contact night-coverage at [email or web address], Office  [phone]  password [email or web address]  05/19/2017, 1:22 PM  LOS: 1 day

## 2017-05-20 LAB — HIV ANTIBODY (ROUTINE TESTING W REFLEX): HIV Screen 4th Generation wRfx: NONREACTIVE

## 2017-05-21 LAB — CULTURE, RESPIRATORY: CULTURE: NORMAL

## 2017-05-21 LAB — CULTURE, RESPIRATORY W GRAM STAIN

## 2017-05-24 LAB — CULTURE, BLOOD (ROUTINE X 2)
CULTURE: NO GROWTH
Culture: NO GROWTH
SPECIAL REQUESTS: ADEQUATE
SPECIAL REQUESTS: ADEQUATE

## 2018-12-26 IMAGING — DX DG CHEST 2V
2 series · 2 of 2 positions shown · non-contrast
Comparison: None.

CLINICAL DATA: Cough, chest pain, shortness of Breath

EXAM:
CHEST  2 VIEW

[chest pa]
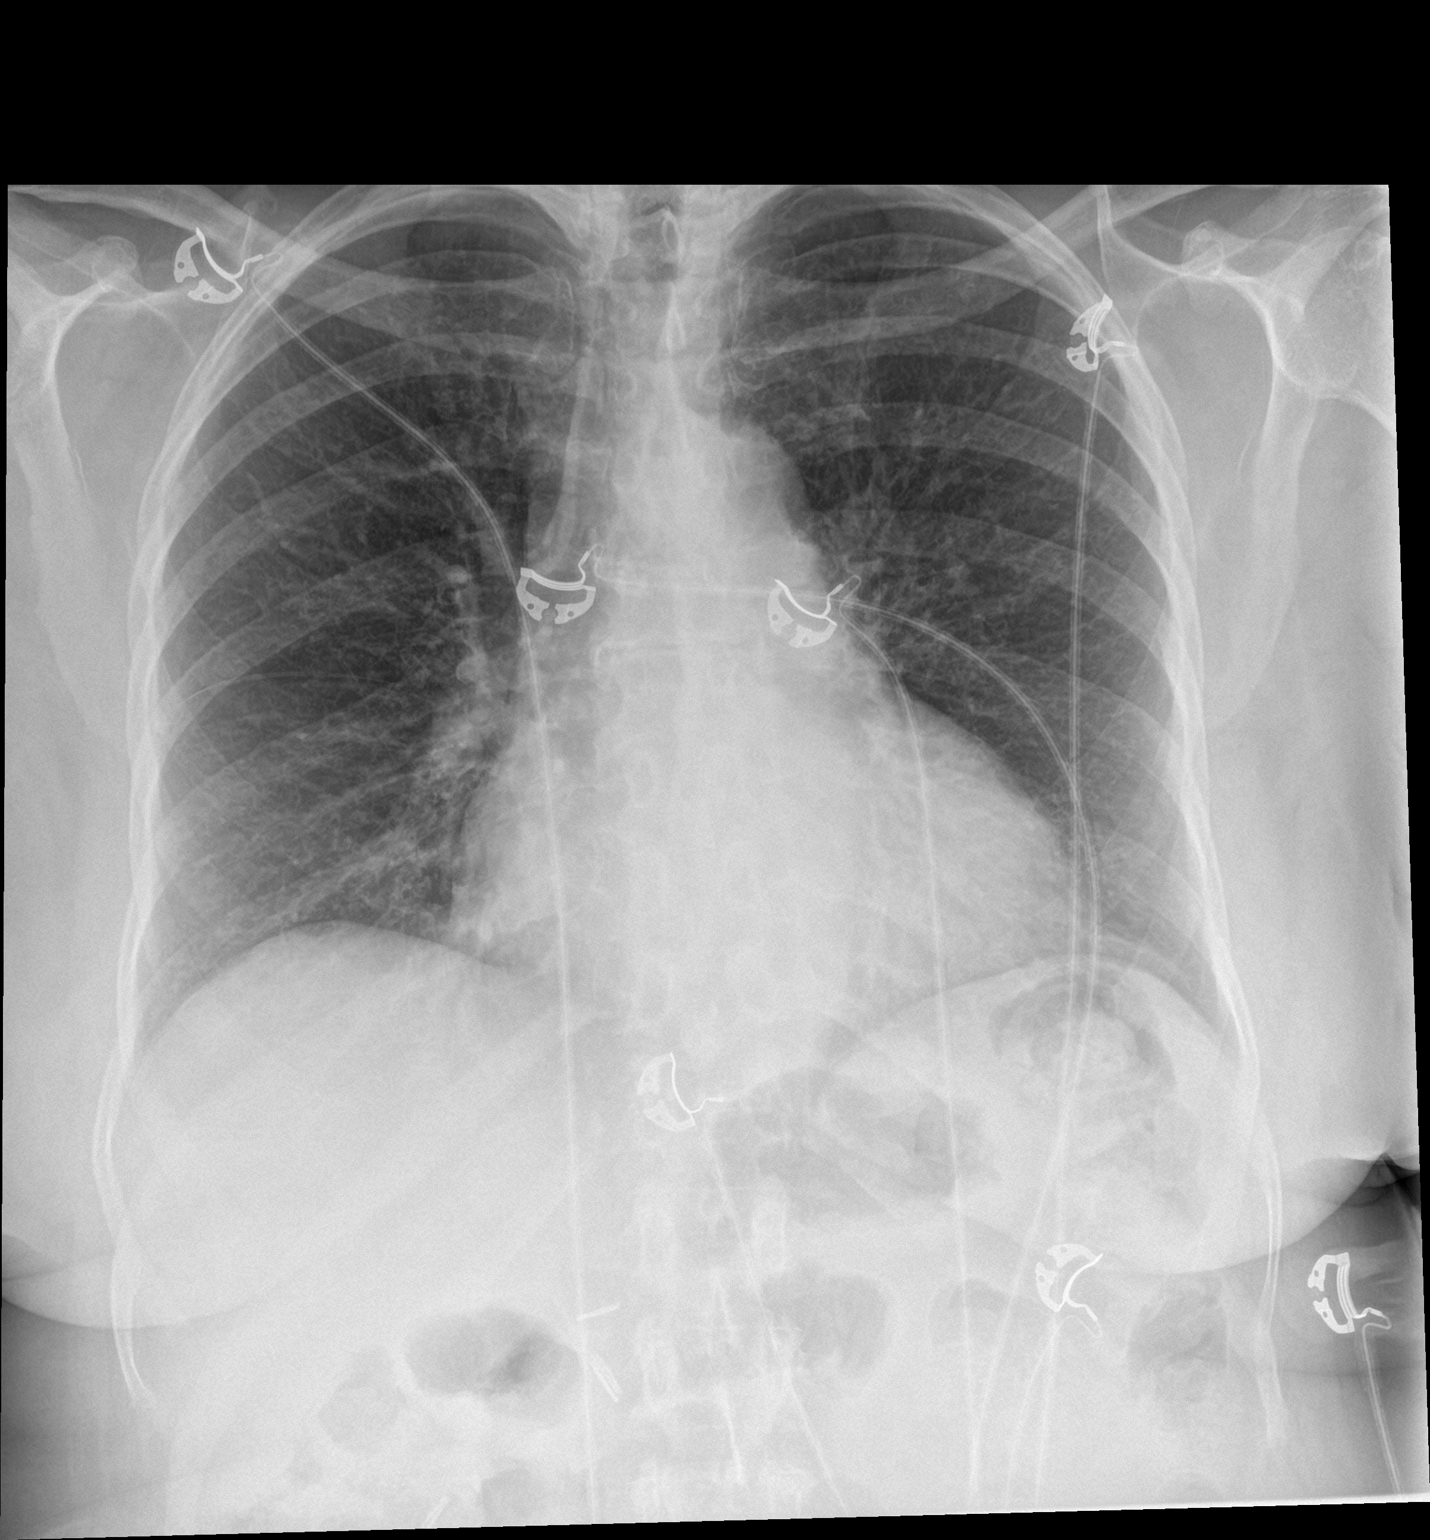

[chest lat]
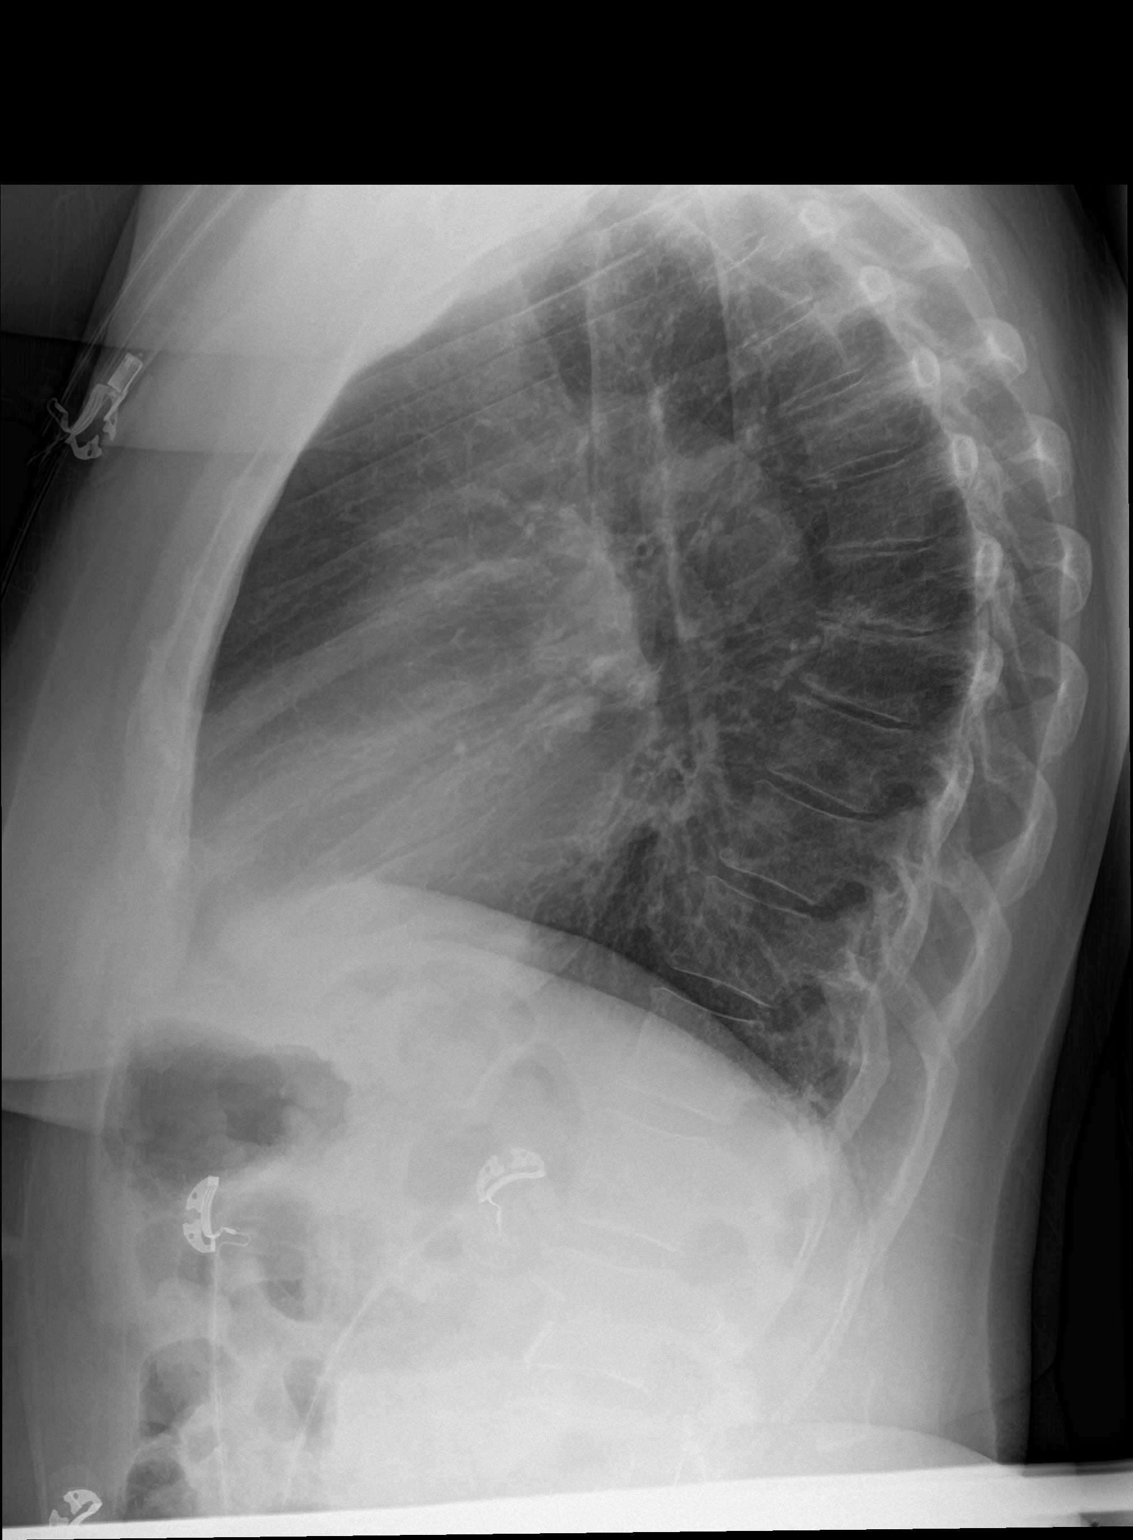

[2 of 2 positions shown; findings below may reference images not displayed]

FINDINGS: Cardiomegaly. Lungs are clear. No effusions. No acute bony
abnormality.
IMPRESSION: Cardiomegaly.  No active disease.

## 2018-12-27 IMAGING — DX DG CHEST 1V PORT
1 series · 1 of 1 positions shown · non-contrast
Comparison: 05/18/2017

CLINICAL DATA: Shortness of breath and sepsis

EXAM:
PORTABLE CHEST 1 VIEW

[chest ap]
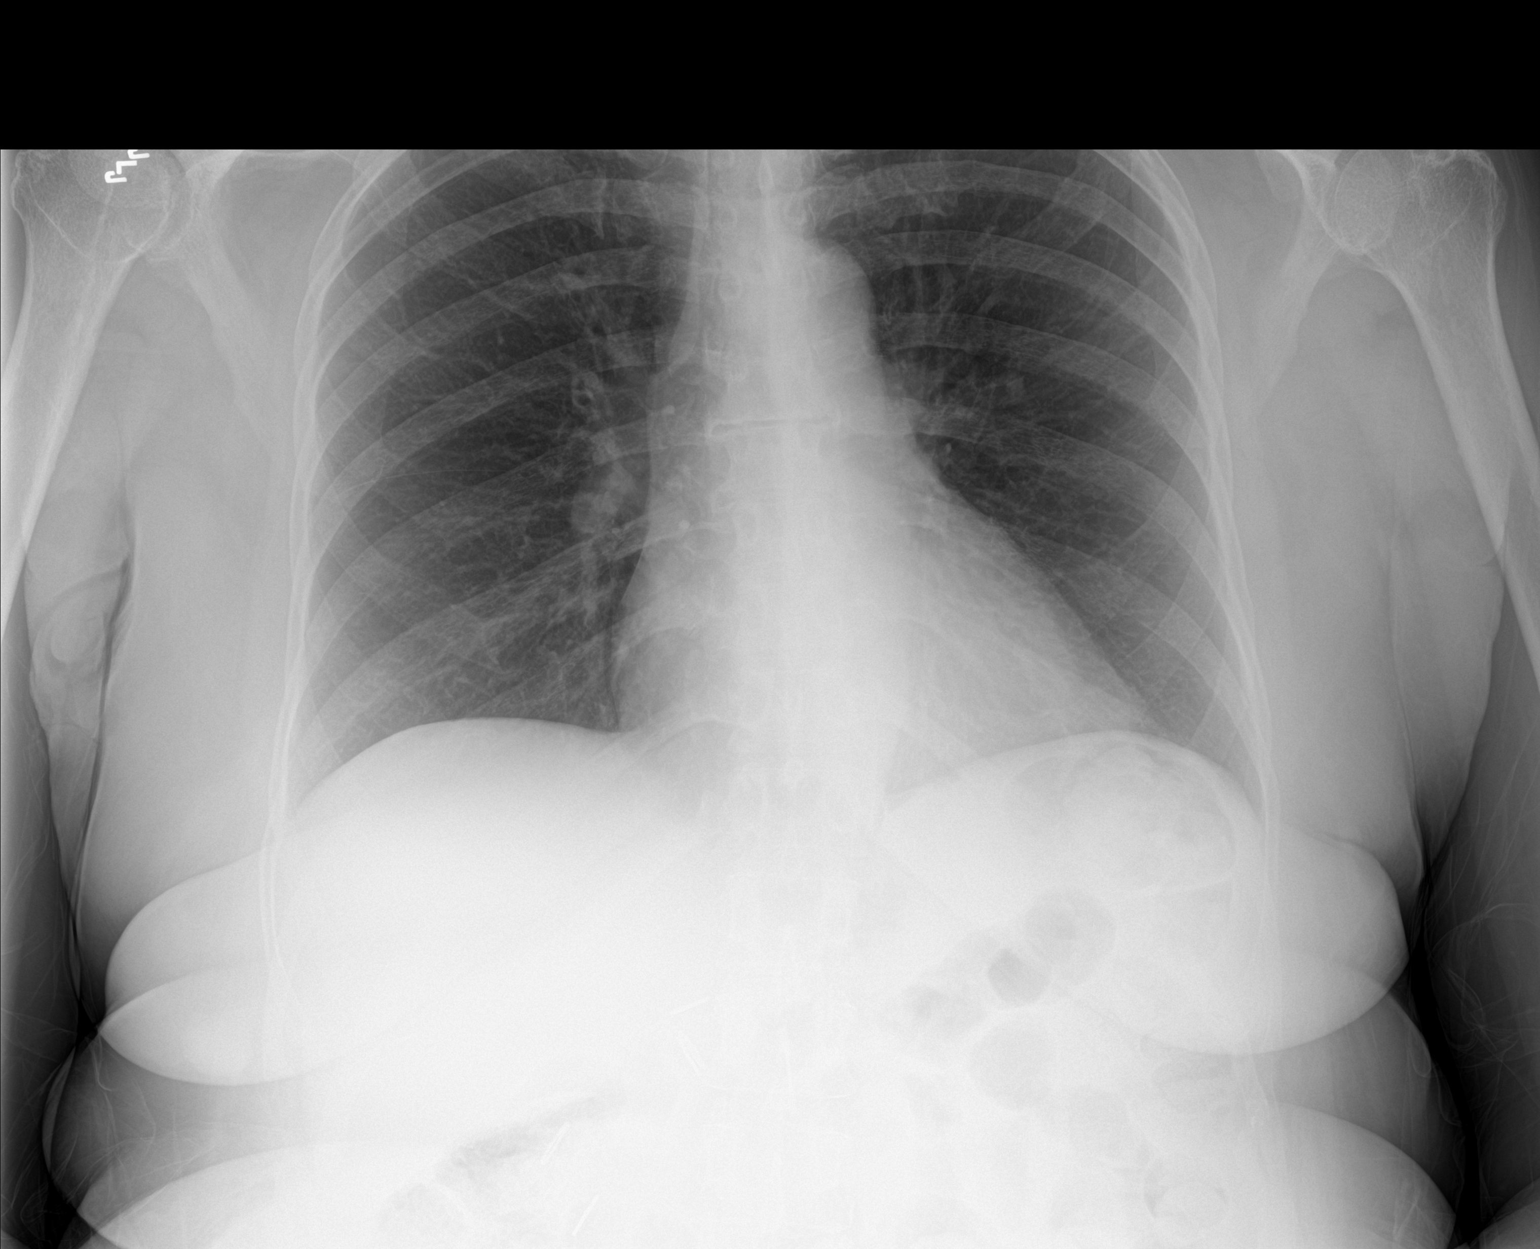

[1 of 1 positions shown; findings below may reference images not displayed]

FINDINGS: The heart size and mediastinal contours are within normal limits.
Both lungs are clear. The visualized skeletal structures are
unremarkable.
IMPRESSION: No active disease.

## 2019-01-17 DEATH — deceased
